# Patient Record
Sex: Female | Born: 1965 | Race: White | Hispanic: No | Marital: Married | Smoking: Never smoker
Health system: Southern US, Community
[De-identification: ages and names within clinical notes are randomized; demographics above are authoritative.]

## PROBLEM LIST (undated history)

## (undated) DIAGNOSIS — Z8669 Personal history of other diseases of the nervous system and sense organs: Secondary | ICD-10-CM

## (undated) DIAGNOSIS — F988 Other specified behavioral and emotional disorders with onset usually occurring in childhood and adolescence: Secondary | ICD-10-CM

## (undated) DIAGNOSIS — Z91419 Personal history of unspecified adult abuse: Secondary | ICD-10-CM

## (undated) DIAGNOSIS — M419 Scoliosis, unspecified: Secondary | ICD-10-CM

## (undated) HISTORY — DX: Other specified behavioral and emotional disorders with onset usually occurring in childhood and adolescence: F98.8

## (undated) HISTORY — DX: Personal history of other diseases of the nervous system and sense organs: Z86.69

## (undated) HISTORY — PX: BREAST BIOPSY: SHX20

## (undated) HISTORY — PX: HYSTEROSCOPY: SHX211

## (undated) HISTORY — DX: Scoliosis, unspecified: M41.9

## (undated) HISTORY — DX: Personal history of unspecified adult abuse: Z91.419

---

## 2002-10-18 ENCOUNTER — Encounter: Admission: RE | Admit: 2002-10-18 | Discharge: 2002-10-18 | Payer: Self-pay | Admitting: Obstetrics and Gynecology

## 2002-10-18 ENCOUNTER — Encounter: Payer: Self-pay | Admitting: Obstetrics and Gynecology

## 2002-10-30 ENCOUNTER — Encounter: Admission: RE | Admit: 2002-10-30 | Discharge: 2002-10-30 | Payer: Self-pay | Admitting: Urology

## 2002-10-30 ENCOUNTER — Encounter: Payer: Self-pay | Admitting: Urology

## 2002-11-30 ENCOUNTER — Encounter: Payer: Self-pay | Admitting: Obstetrics and Gynecology

## 2002-12-03 ENCOUNTER — Inpatient Hospital Stay (HOSPITAL_COMMUNITY): Admission: RE | Admit: 2002-12-03 | Discharge: 2002-12-05 | Payer: Self-pay | Admitting: Obstetrics and Gynecology

## 2002-12-03 ENCOUNTER — Encounter: Payer: Self-pay | Admitting: Obstetrics and Gynecology

## 2002-12-03 ENCOUNTER — Encounter (INDEPENDENT_AMBULATORY_CARE_PROVIDER_SITE_OTHER): Payer: Self-pay | Admitting: Specialist

## 2006-08-19 ENCOUNTER — Encounter: Admission: RE | Admit: 2006-08-19 | Discharge: 2006-08-19 | Payer: Self-pay

## 2006-08-25 ENCOUNTER — Encounter: Admission: RE | Admit: 2006-08-25 | Discharge: 2006-08-25 | Payer: Self-pay

## 2006-09-02 ENCOUNTER — Encounter (INDEPENDENT_AMBULATORY_CARE_PROVIDER_SITE_OTHER): Payer: Self-pay | Admitting: Specialist

## 2006-09-02 ENCOUNTER — Encounter: Admission: RE | Admit: 2006-09-02 | Discharge: 2006-09-02 | Payer: Self-pay

## 2006-09-15 ENCOUNTER — Encounter: Admission: RE | Admit: 2006-09-15 | Discharge: 2006-09-15 | Payer: Self-pay

## 2006-09-21 ENCOUNTER — Ambulatory Visit: Payer: Self-pay | Admitting: Oncology

## 2006-10-14 ENCOUNTER — Encounter: Admission: RE | Admit: 2006-10-14 | Discharge: 2006-10-14 | Payer: Self-pay | Admitting: Surgery

## 2006-10-17 ENCOUNTER — Ambulatory Visit (HOSPITAL_BASED_OUTPATIENT_CLINIC_OR_DEPARTMENT_OTHER): Admission: RE | Admit: 2006-10-17 | Discharge: 2006-10-17 | Payer: Self-pay | Admitting: Surgery

## 2006-10-17 ENCOUNTER — Encounter: Admission: RE | Admit: 2006-10-17 | Discharge: 2006-10-17 | Payer: Self-pay

## 2006-10-17 ENCOUNTER — Encounter (INDEPENDENT_AMBULATORY_CARE_PROVIDER_SITE_OTHER): Payer: Self-pay | Admitting: Specialist

## 2006-11-14 ENCOUNTER — Ambulatory Visit: Payer: Self-pay | Admitting: Oncology

## 2008-04-10 ENCOUNTER — Encounter: Admission: RE | Admit: 2008-04-10 | Discharge: 2008-04-10 | Payer: Self-pay

## 2008-04-18 ENCOUNTER — Encounter: Admission: RE | Admit: 2008-04-18 | Discharge: 2008-04-18 | Payer: Self-pay

## 2008-04-19 ENCOUNTER — Encounter: Admission: RE | Admit: 2008-04-19 | Discharge: 2008-04-19 | Payer: Self-pay

## 2008-04-19 ENCOUNTER — Encounter (INDEPENDENT_AMBULATORY_CARE_PROVIDER_SITE_OTHER): Payer: Self-pay | Admitting: Diagnostic Radiology

## 2008-05-05 IMAGING — CR DG CHEST 2V
2 series · 2 of 2 positions shown · non-contrast
Comparison: [HOSPITAL] chest x-ray report, 11/30/02.

CLINICAL DATA: Preop respiratory exam for left breast surgery.  Atypical ductal hyperplasia.
CHEST - TWO VIEWS:

[w chest pa]
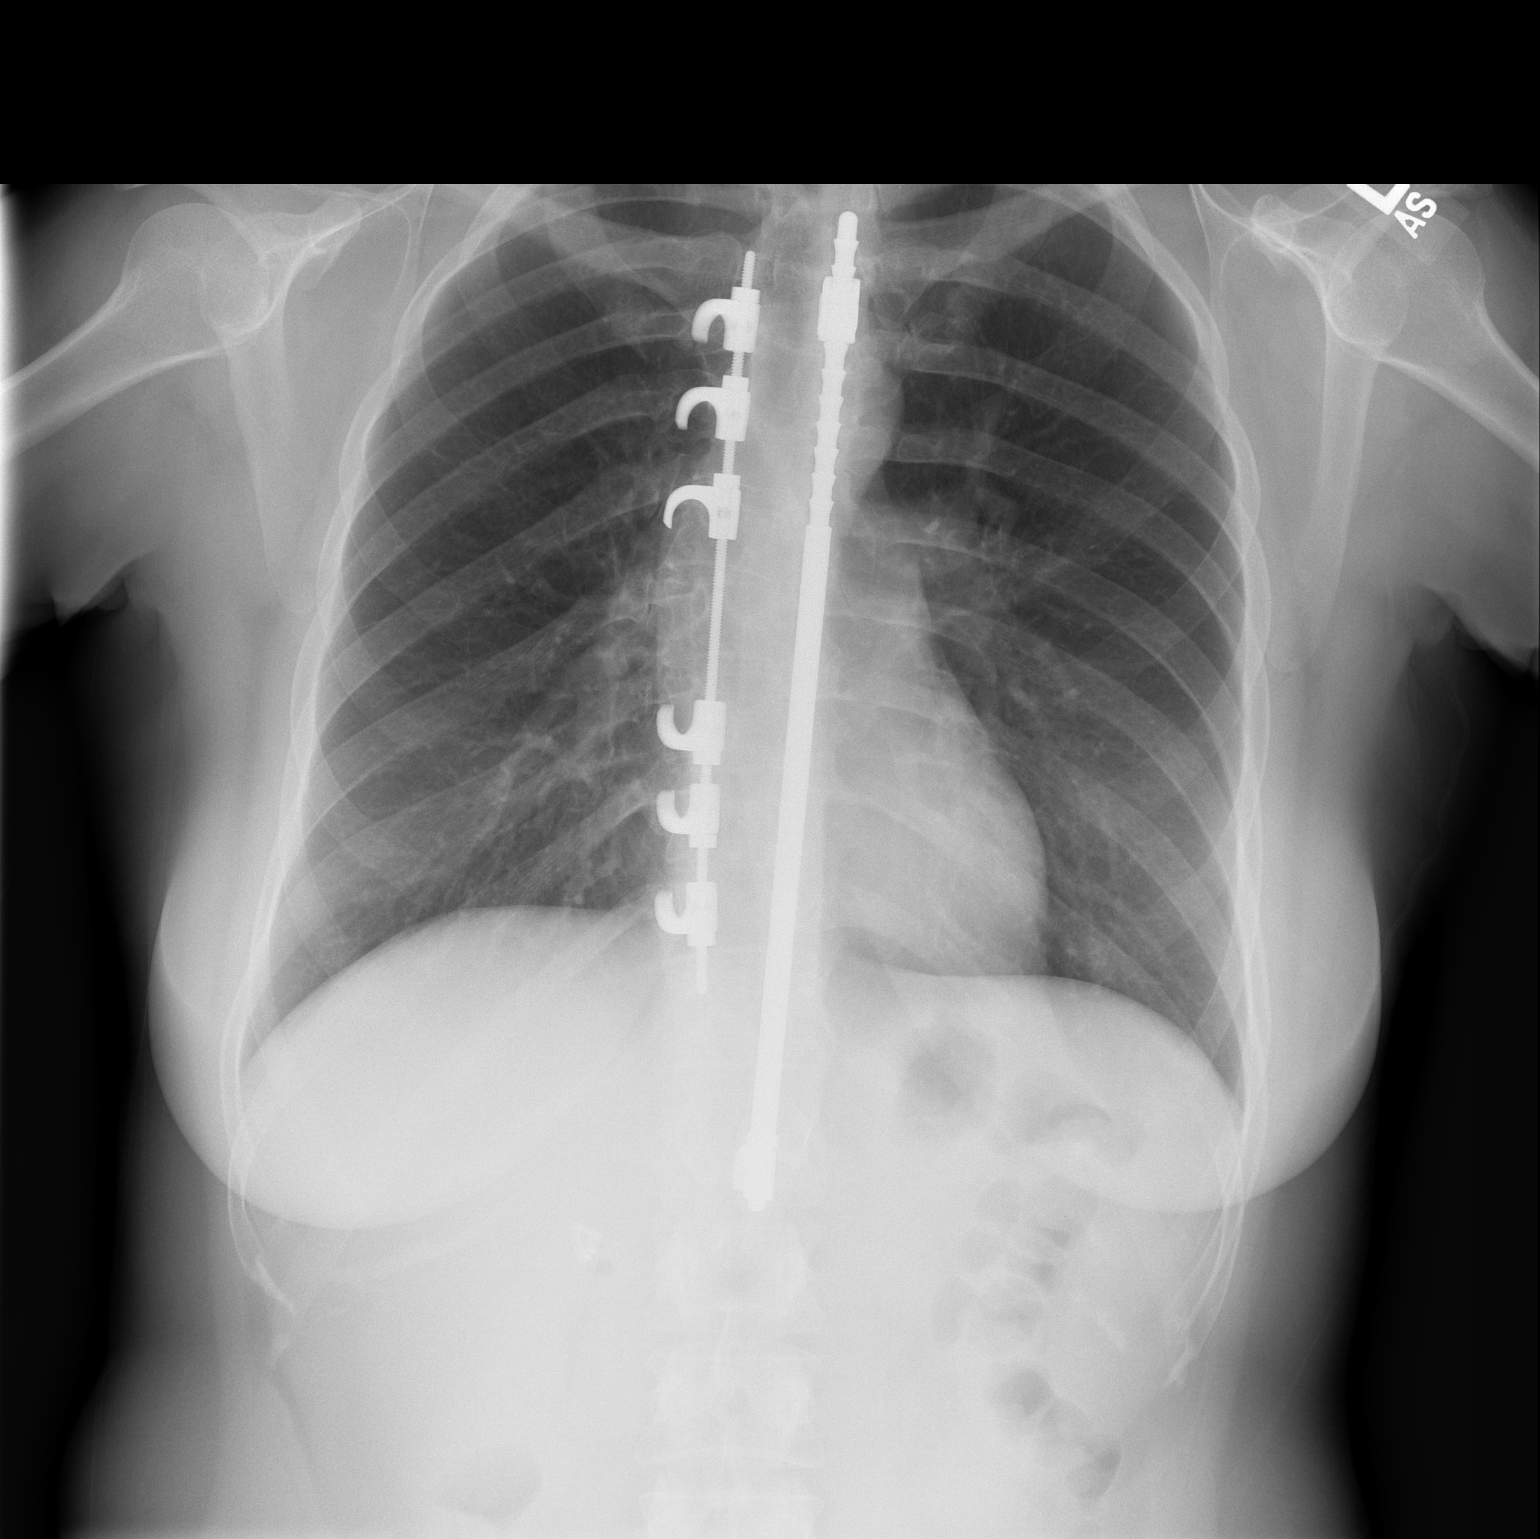

[w chest lat]
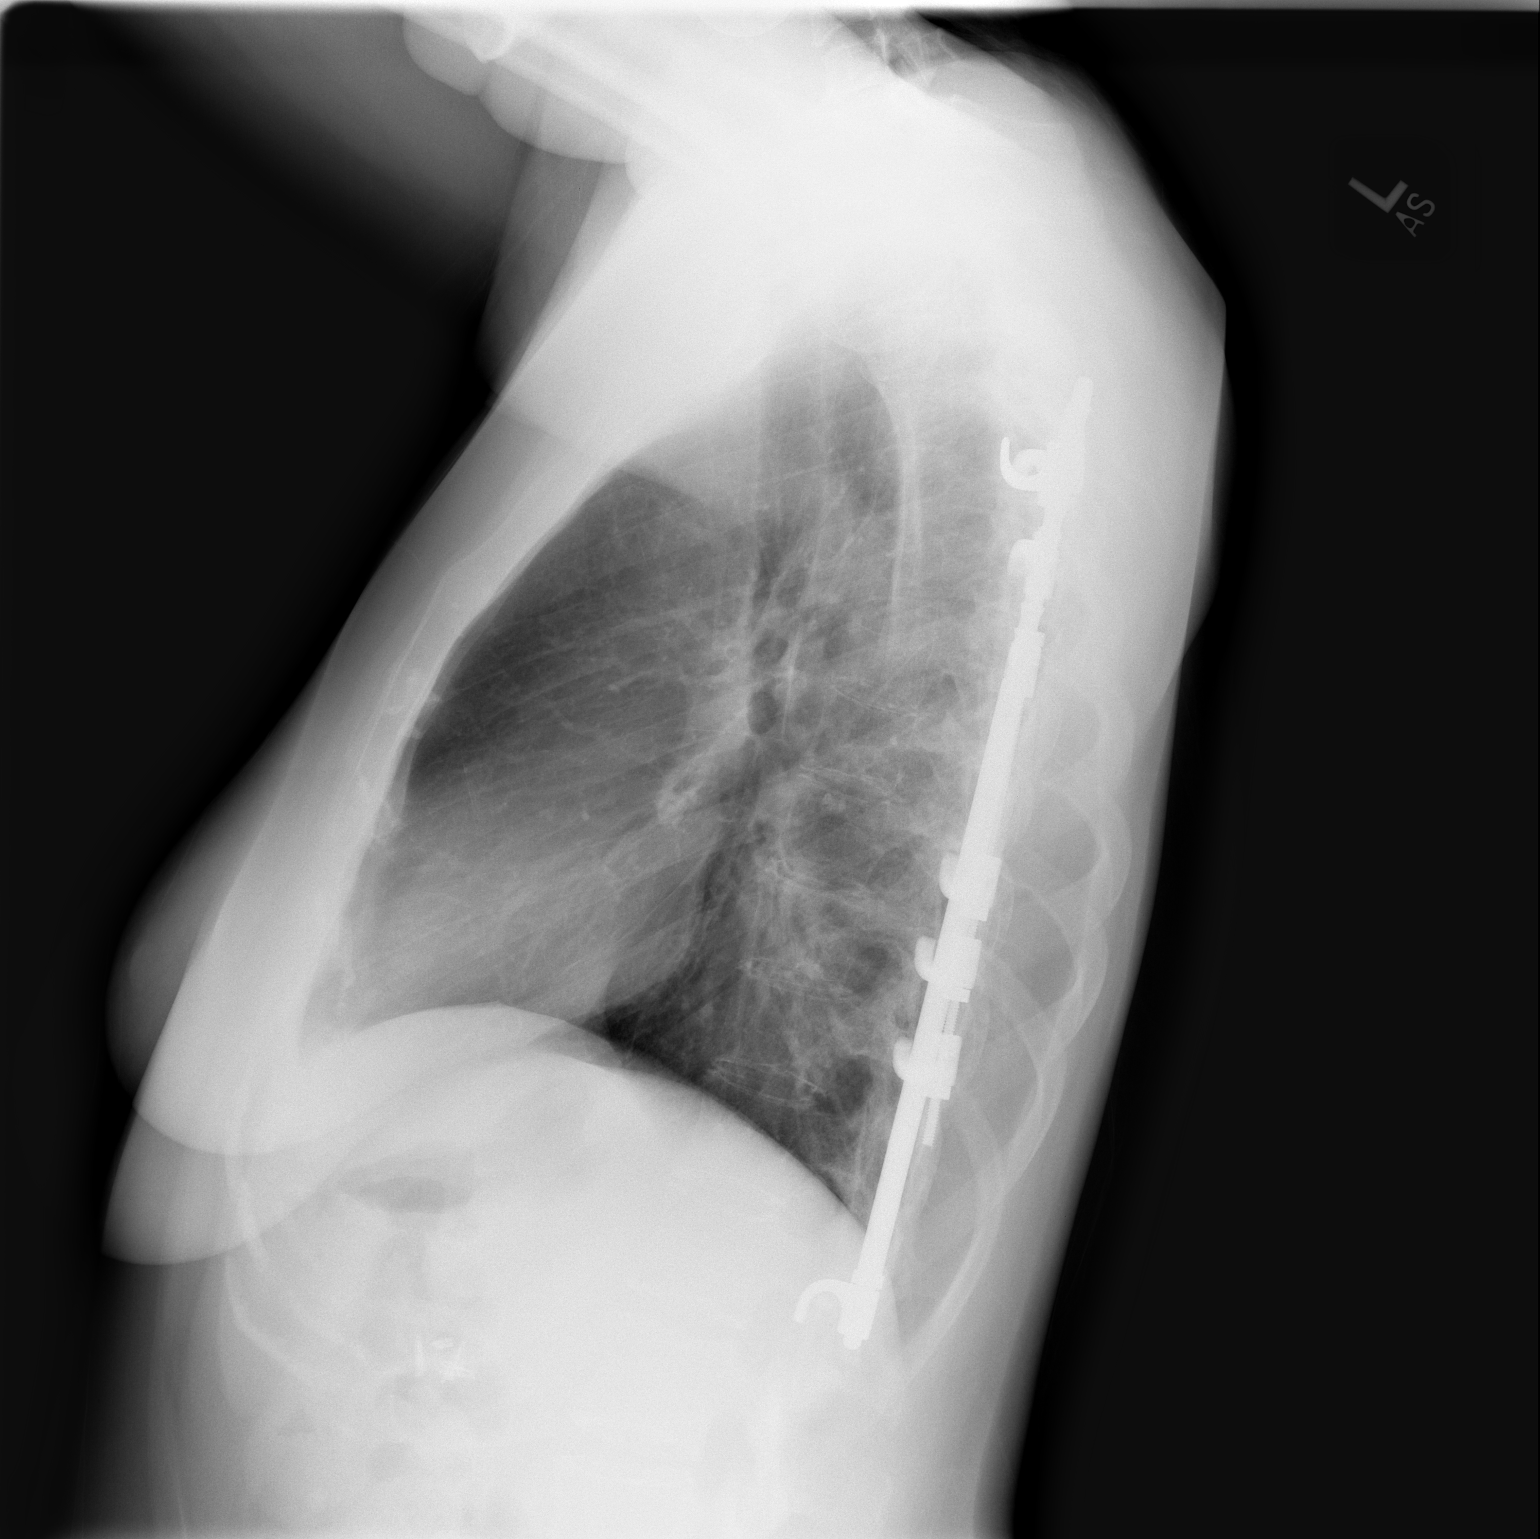

[2 of 2 positions shown; findings below may reference images not displayed]

The heart size and mediastinal contours are within normal limits.  Both lungs are clear.  The visualized skeletal structures are unremarkable with slight dextroscoliosis of thoracic spine with Pasupulati distraction rods noted.
IMPRESSION: No active cardiopulmonary disease.

## 2009-08-27 ENCOUNTER — Encounter: Admission: RE | Admit: 2009-08-27 | Discharge: 2009-08-27 | Payer: Self-pay

## 2010-10-18 ENCOUNTER — Encounter: Payer: Self-pay | Admitting: Orthopedic Surgery

## 2011-02-12 NOTE — Op Note (Signed)
[name], [name]                           ACCOUNT NO.:  [id number]   MEDICAL RECORD NO.:  [id number]                   PATIENT TYPE:  INP   LOCATION:  9118                                 FACILITY:  [facility]   PHYSICIAN:  Lenoard Aden, M.D.             DATE OF BIRTH:  11/17/1965   DATE OF PROCEDURE:  12/03/2002  DATE OF DISCHARGE:                                 OPERATIVE REPORT   PREOPERATIVE DIAGNOSIS:  Symptomatic uterine fibroids.   POSTOPERATIVE DIAGNOSES:  1. Symptomatic uterine fibroids.  2. Enterocele.   PROCEDURES:  1. Total abdominal hysterectomy.  2. Rogelio Seen culdoplasty.   SURGEON:  Lenoard Aden, M.D.   ASSISTANT:  Pershing Cox, M.D.   ANESTHESIA:  General.   ESTIMATED BLOOD LOSS:  500 mL.   COMPLICATIONS:  None.   DRAINS:  Foley.   COUNTS:  Correct.   DISPOSITION:  Patient to recovery in good condition.   DESCRIPTION OF PROCEDURE:  After completion of her laparoscopic  cholecystectomy, dilute Marcaine is placed in the area of a previous  Pfannenstiel skin incision, which is opened using a scalpel, carried down to  the fascia using electrocautery.  The fascia opened transversely, peritoneum  entered sharply, uterus exteriorized.  A bulky uterus, normal ovaries.  Round ligaments are bilaterally grasped and ligated using a LigaSure and  also suture ligated and held.  Retroperitoneal space entered.  Tubo-ovarian  ligament is bilaterally grasped and ligated using a LigaSure.  The uterine  vessels are skeletonized bilaterally and divided using the LigaSure.  The  bladder flap is dissected sharply.  There is evidence of sharp bladder  adhesions from previous C-section noted.  These are dissected atraumatically  off the lower uterine segment.  Uterine vessels having been skeletonized,  clamped, and divided bilaterally, progressive clamping down the broad and  cardinal ligament complexes are achieved using the LigaSure.  Uterosacral  ligaments  are identified, grasped, and suture ligated using Heaney clamps  and a 0 Vicryl suture.  The vagina is entered sharply.  The specimen is  removed.  A 700 g uterus, which is weighed in the room.  At this time  enterocele is identified and the angle sutures are placed bilaterally in a  standard fashion.  The vagina closed side-to-side.  McCall culdoplasty  suture using a 0 Vicryl suture placed.  Irrigation accomplished.  Good  hemostasis noted.  Ureters identified bilaterally and found to be of normal  size and caliber and peristalsing normally.  Normal appendix  visualized.  Vaginal cuff hemostatic.  Uterosacral plication of the  ligaments in the midline, which are tied together.  At this time fascia is  closed using #1 Monocryl in a continuous running fashion.  Irrigation is  accomplished.  Skin is closed using staples.  The patient is awakened and  transferred to recovery in good condition.  Lenoard Aden, M.D.    RJT/MEDQ  D:  12/03/2002  T:  12/03/2002  Job:  [id number]

## 2011-02-12 NOTE — Op Note (Signed)
NAMESHIRRELL, [name]                 ACCOUNT NO.:  [id number]   MEDICAL RECORD NO.:  [id number]          PATIENT TYPE:  AMB   LOCATION:  DSC                          FACILITY:  [facility]   PHYSICIAN:  Thomas A. Cornett, M.D.DATE OF BIRTH:  [birth year]   DATE OF PROCEDURE:  DATE OF DISCHARGE:                               OPERATIVE REPORT   PREOPERATIVE DIAGNOSIS:  Left breast mass showing atypical ductal  hyperplasia on core biopsy.   POSTOPERATIVE DIAGNOSIS:  Left breast mass showing atypical ductal  hyperplasia on core biopsy.   PROCEDURE:  Left breast needle localized excisional biopsy.   SURGEON:  Harriette Bouillon, M.D.   ANESTHESIA:  MAC with approximately 30 cc of 0.25% Sensorcaine.   SPECIMEN:  Left breast tissue with two localizing wires, one anterior  one posterior.  Additional left breast tissue taken from the anterior  margin.   DRAINS:  None.   ESTIMATED BLOOD LOSS:  20 cc.   INDICATIONS FOR PROCEDURE:  The patient is a 45 year old female found to  have a cluster of left breast microcalcifications on mammogram.  Biopsy  showed atypical ductal hyperplasia.  This was an area so a bracketed  left breast needle-localized excisional biopsy was recommended for  further diagnosis.  I discussed this with the patient.  I explained the  procedure to her as well as complications.  She understood and agreed to  proceed.   DESCRIPTION OF PROCEDURE:  The patient was brought to the operating room  after undergoing left breast needle localization by the radiologist.  Two wires were used to localize the area.  After this was done, the  patient was taken back to the operating room where her left breast was  prepped and draped in sterile fashion.  After MAC anesthesia, local  anesthesia was infiltrated in the left upper outer quadrant between the  two wires.  Incision was made between both wires.  Dissection was  carried down to encompass both areas of the wire.  We were so close to  the anterior margin.  Both wire tissues were sent and the anterior  margin was felt to be close and there was some concern that some of the  calcifications may have been left out.  I was able to identify this area  anteriorly and excised it using a scalpel and sent it to pathology.  The  stitch marked the new surgical margin.  The cavity was inspected and  found it be hemostatic.  This tissue was found to be adequate from the  standpoint of both wires being in it and again the anterior margins were  re-excised for further evaluation.  Hemostasis was achieved.  The wound  was closed in layers using a 3-0 Vicryl deep stitch and a 4-0 Monocryl  in a subcuticular fashion.  All sponge, needle and  instruments were found to be correct this portion of the case.  Dermabond was used as a dressing over the incision.  All final counts  were found to be correct a second time.  The patient was awoke and taken  to the recovery in  satisfactory condition.      Thomas A. Cornett, M.D.  Electronically Signed     TAC/MEDQ  D:  10/17/2006  T:  10/17/2006  Job:  [id number]   cc:   Konrad Felix

## 2011-02-12 NOTE — Discharge Summary (Signed)
   [name], [name]                           ACCOUNT NO.:  [id number]   MEDICAL RECORD NO.:  [id number]                   PATIENT TYPE:  INP   LOCATION:  9118                                 FACILITY:  [facility]   PHYSICIAN:  Lenoard Aden, M.D.             DATE OF BIRTH:  [birth year]   DATE OF ADMISSION:  12/03/2002  DATE OF DISCHARGE:  12/05/2002                                 DISCHARGE SUMMARY   HISTORY OF PRESENT ILLNESS:  The patient underwent uncomplicated  laparoscopic cholecystectomy followed by total abdominal hysterectomy and  [name] on December 03, 2002. Postoperative course uncomplicated.  Hemoglobin stable.   DISPOSITION:  Discharged to home on day two. Darvocet and Motrin given for  pain.   FOLLOW UP:  Follow-up in the office in 4-6 weeks. Discharge teaching done.  Iron therapy previously recommended and discussed.                                               Lenoard Aden, M.D.    RJT/MEDQ  D:  01/12/2003  T:  01/12/2003  Job:  [id number]

## 2011-02-12 NOTE — H&P (Signed)
[name], [name]                           ACCOUNT NO.:  [id number]   MEDICAL RECORD NO.:  [id number]                   PATIENT TYPE:  INP   LOCATION:  NA                                   [facility]ACILITY[facility]   PHYSICIAN:  [name], M.D.             DATE OF BIRTH:  May 24, 1966   DATE OF ADMISSION:  12/03/2002  DATE OF DISCHARGE:                                HISTORY & PHYSICAL   CHIEF COMPLAINT:  Bleeding and secondary anemia.   HISTORY OF PRESENT ILLNESS:  The patient is a 45 year old white female G4,  P3, with a history of irregular bleeding and secondary anemia down to 7.7.  She has had known history of uterine fibroids which have been followed  previously.  She had a large lesion which was documented by CT scan noted to  a uterine fibroid.  She was placed on iron therapy and has moved her  hemoglobin to 11.6.  She had a CAT scan in which she had a questionable  renal cortical cyst which was addressed by Dr. Marcelyn Warren and felt to  benign, in addition the suggestion of gallbladder disease for which she has  seen Dr. Jerelene Warren, and although the gallbladder removal is not vital  the patient has elected to proceed with removal at this time.   PAST MEDICAL HISTORY:  Tubal ligation and a previous C-section with that  after which time she had a paratubal salpingectomy in addition to removal of  pieces of both of her tubes for tubal ligation.  She otherwise has a history  of two vaginal deliveries.  She has no other medical or surgical  hospitalizations as noted.  She has had one uncomplicated miscarriage and  three total deliveries - two vaginal, one otherwise.  She has had a blood  transfusion in 1981 and has a remote history of asthma.   FAMILY HISTORY:  Grandfather with diabetes; grandparents with heart disease  and hypotension.   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   PHYSICAL EXAMINATION:  GENERAL:  She is a  well-developed, well-nourished  white female in no apparent distress.  HEENT:  Normal.  LUNGS:  Clear.  HEART:  Regular rhythm.  ABDOMEN:  Soft, nontender; uterus to 18 to 20 weeks' size; no adnexal masses  are appreciated.  EXTREMITIES:  No cords.  NEUROLOGICAL:  Nonfocal.   IMPRESSION:  1. Symptomatic uterine fibroids with secondary anemia.  2. Cholelithiasis.  3. Benign renal cysts.  4. History of previous cesarean section with tubal ligation.    PLAN:  Proceed with laparoscopic cholecystectomy.  Surgical consent to be  done by Dr. Maryagnes Warren followed by a TAH, possible BSO.  Risks of anesthesia,  infection, bleeding, injury to abdominal organs and need for repairs  discussed, delayed versus immediate complications to include bowel and  bladder injury noted.  The patient acknowledges  and wishes to proceed.                                               [name], M.D.    RJT/MEDQ  D:  12/02/2002  T:  12/02/2002  Job:  [id number]

## 2011-02-12 NOTE — Op Note (Signed)
[name], [name]                           ACCOUNT NO.:  [id number]   MEDICAL RECORD NO.:  [id number]                   PATIENT TYPE:  INP   LOCATION:  9399                                 FACILITY:  [facility]   PHYSICIAN:  Gita Kudo, M.D.              DATE OF BIRTH:  10-02-1965   DATE OF PROCEDURE:  12/03/2002  DATE OF DISCHARGE:                                 OPERATIVE REPORT   PREOPERATIVE DIAGNOSIS:  Gallstones.   POSTOPERATIVE DIAGNOSIS:  Gallstones, normal-appearing intraoperative  cholangiogram.   PROCEDURE:  Laparoscopic cholecystectomy with intraoperative cholangiogram.   SURGEON:  Gita Kudo, M.D.   ASSISTANT:  Donnie Coffin. Samuella Cota, M.D.   ANESTHESIA:  General endotracheal.   CLINICAL SUMMARY:  A 45 year old female with bouts of abdominal pain but  nonspecific for acute biliary disease.  A gallbladder ultrasound and CT scan  had been obtained, and they show gallstones.  No acute inflammation.  Her  liver function studies are normal.   OPERATIVE FINDINGS:  The patient's gallstones was thin-walled and appeared  to have several stones in it.  There was a small stone that was impacted in  the cystic duct that was removed before doing the cholangiogram, which  appeared normal.   DESCRIPTION OF PROCEDURE:  The patient was given intravenous Cipro, prepped  and draped in a standard fashion for both laparoscopic cholecystectomy and  abdominal hysterectomy by Dr. Billy Coast.  A small midline incision was made at  the umbilicus and carried down into the peritoneum.  The fascia was  controlled with a figure-of-eight 0 Vicryl suture and operating Hasson port  inserted and good CO2 pneumoperitoneum established.  Then through Marcaine-  infiltrated skin incisions, two #5 ports placed laterally and a second #10  medially.  With the lateral port graspers giving excellent exposure, we  operated through the medial port and carefully dissected the gallbladder-  cystic duct  junction.  A clip was placed on the gallbladder at the junction  and then an incision made in the cystic duct.  The stone was manipulated out  of the opening and then through a second puncture wound, a cholangiogram  catheter placed, secured, and good films taken.  The proximal system all  looked fine, and likewise the distal system had no obstruction with good  flow into the common duct.  Then this catheter was withdrawn and the cystic  duct controlled with multiple clips and divided.  The cystic artery was  dissected and when we were certain of its anatomy with circumferential  visualization, it likewise was controlled with clips and divided.  The  gallbladder was then removed from below upward using the coagulating spatula  for hemostasis and dissection.  The operative site was lavaged with saline  and noted to be dry.  Then cameras and ports and CO2 were released.  The  midline  closed with the previous figure-of-eight and a  second interrupted 0 Vicryl.  Subcu approximated with 4-0 Vicryl and then staples applied to the skin.  Sterile dressings will be placed latera after Dr. Billy Coast completes the  hysterectomy, and he came in to perform that portion.                                               Gita Kudo, M.D.    MRL/MEDQ  D:  12/03/2002  T:  12/03/2002  Job:  [id number]   cc:   Lenoard Aden, M.D.  [address], [address]  [place]  [place] [address]  Fax: [phone]

## 2021-08-25 NOTE — Progress Notes (Deleted)
NEUROLOGY CONSULTATION NOTE  [name] MRN: [id number] DOB: 01/29/1966  Referring provider: Graylon Gunning, PA-C Primary care provider: Graylon Gunning, PA-C  Reason for consult:  headache  Assessment/Plan:   ***   Subjective:  [name] is a 55 year old female who presents for headaches.  History supplemented by referring provider's note.  Onset:  March 2021 following COVID infection Location:  band-like distribution Quality:  *** Intensity:  ***.  *** denies new headache, thunderclap headache or severe headache that wakes *** from sleep. Aura:  *** Prodrome:  *** Postdrome:  *** Associated symptoms:  ***dizziness.  Sometimes associated with speech disturbance (slurred).  *** denies associated unilateral numbness or weakness. Duration:  *** Frequency:  *** Frequency of abortive medication: *** Triggers:  *** Relieving factors:  *** Activity:  ***  Current NSAIDS/analgesics:  Tylenol, Advil, Toradol Current triptans:  *** Current ergotamine:  *** Current anti-emetic:  *** Current muscle relaxants:  *** Current Antihypertensive medications:  *** Current Antidepressant medications:  *** Current Anticonvulsant medications:  *** Current anti-CGRP:  Emgality Current Vitamins/Herbal/Supplements:  Magnesium oxide 400mg , Zinc, C Current Antihistamines/Decongestants:  Allegra Other therapy:  *** Hormone/birth control:  *** Other medications:  Adderal  Past NSAIDS/analgesics:  Percocet/opioids (uticaria) Past abortive triptans:  *** Past abortive ergotamine:  *** Past muscle relaxants:  *** Past anti-emetic:  *** Past antihypertensive medications:  *** Past antidepressant medications:  *** Past anticonvulsant medications:  *** Past anti-CGRP:  Nurtec Past vitamins/Herbal/Supplements:  *** Past antihistamines/decongestants:  *** Other past therapies:  ***  Caffeine:  *** Alcohol:  *** Smoker:  *** Diet:  *** Exercise:  *** Depression:  ***; Anxiety:   *** Other pain:  *** Sleep hygiene:  *** Family history of headache:  ***      PAST MEDICAL HISTORY: No past medical history on file.  PAST SURGICAL HISTORY: *** The histories are not reviewed yet. Please review them in the "History" navigator section and refresh this SmartLink.  MEDICATIONS: No current outpatient medications on file prior to visit.   No current facility-administered medications on file prior to visit.    ALLERGIES: Not on File  FAMILY HISTORY: No family history on file.  Objective:  *** General: No acute distress.  Patient appears well-groomed.   Head:  Normocephalic/atraumatic Eyes:  fundi examined but not visualized Neck: supple, no paraspinal tenderness, full range of motion Back: No paraspinal tenderness Heart: regular rate and rhythm Lungs: Clear to auscultation bilaterally. Vascular: No carotid bruits. Neurological Exam: Mental status: alert and oriented to person, place, and time, recent and remote memory intact, fund of knowledge intact, attention and concentration intact, speech fluent and not dysarthric, language intact. Cranial nerves: CN I: not tested CN II: pupils equal, round and reactive to light, visual fields intact CN III, IV, VI:  full range of motion, no nystagmus, no ptosis CN V: facial sensation intact. CN VII: upper and lower face symmetric CN VIII: hearing intact CN IX, X: gag intact, uvula midline CN XI: sternocleidomastoid and trapezius muscles intact CN XII: tongue midline Bulk & Tone: normal, no fasciculations. Motor:  muscle strength 5/5 throughout Sensation:  Pinprick, temperature and vibratory sensation intact. Deep Tendon Reflexes:  2+ throughout,  toes downgoing.   Finger to nose testing:  Without dysmetria.   Heel to shin:  Without dysmetria.   Gait:  Normal station and stride.  Romberg negative.    Thank you for allowing me to take part in the care of this patient.   Everlena Cooper, DO  CC: Graylon Gunning,  PA-C

## 2021-08-26 ENCOUNTER — Ambulatory Visit: Payer: BC Managed Care – PPO | Admitting: Neurology
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
## 2021-10-13 ENCOUNTER — Ambulatory Visit: Payer: BC Managed Care – PPO | Admitting: Neurology

## 2021-10-13 ENCOUNTER — Encounter: Payer: Self-pay | Admitting: Neurology

## 2021-10-13 ENCOUNTER — Other Ambulatory Visit: Payer: Self-pay

## 2021-10-13 VITALS — BP 111/68 | HR 93 | Ht 64.0 in | Wt 149.0 lb

## 2021-10-13 DIAGNOSIS — M542 Cervicalgia: Secondary | ICD-10-CM

## 2021-10-13 DIAGNOSIS — G43009 Migraine without aura, not intractable, without status migrainosus: Secondary | ICD-10-CM | POA: Diagnosis not present

## 2021-10-13 MED ORDER — ONDANSETRON 4 MG PO TBDP: 4.0000 mg | ORAL_TABLET | Freq: Three times a day (TID) | ORAL | 5 refills | Status: AC | PRN

## 2021-10-13 MED ORDER — SUMATRIPTAN SUCCINATE 100 MG PO TABS: 100.0000 mg | ORAL_TABLET | Freq: Once | ORAL | 5 refills | Status: AC | PRN

## 2021-10-13 MED ORDER — AIMOVIG 140 MG/ML ~~LOC~~ SOAJ: 140.0000 mg | SUBCUTANEOUS | 5 refills | Status: AC

## 2021-10-13 NOTE — Patient Instructions (Signed)
°  Physical therapy for neck pain Start Aimovig 140mg  every 28 days.   Take sumatriptan 100mg  at earliest onset of headache.  May repeat dose once in 2 hours if needed.  Maximum 2 tablets in 24 hours.  May still take Nurtec if needed Ondansetron for nausea as needed Limit use of pain relievers to no more than 2 days out of the week.  These medications include acetaminophen, NSAIDs (ibuprofen/Advil/Motrin, naproxen/Aleve, triptans (Imitrex/sumatriptan), Excedrin, and narcotics.  This will help reduce risk of rebound headaches. Be aware of common food triggers:  - Caffeine:  coffee, black tea, cola, Mt. Dew  - Chocolate  - Dairy:  aged cheeses (brie, blue, cheddar, gouda, [place], provolone, [place], Swiss, etc), chocolate milk, buttermilk, sour cream, limit eggs and yogurt  - Nuts, peanut butter  - Alcohol  - Cereals/grains:  FRESH breads (fresh bagels, sourdough, doughnuts), yeast productions  - Processed/canned/aged/cured meats (pre-packaged deli meats, hotdogs)  - MSG/glutamate:  soy sauce, flavor enhancer, pickled/preserved/marinated foods  - Sweeteners:  aspartame (Equal, Nutrasweet).  Sugar and Splenda are okay  - Vegetables:  legumes (lima beans, lentils, snow peas, fava beans, pinto peans, peas, garbanzo beans), sauerkraut, onions, olives, pickles  - Fruit:  avocados, bananas, citrus fruit (orange, lemon, grapefruit), mango  - Other:  Frozen meals, macaroni and cheese Routine exercise Stay adequately hydrated (aim for 64 oz water daily) Keep headache diary Maintain proper stress management Maintain proper sleep hygiene Do not skip meals Consider supplements:  magnesium citrate 400mg  daily, riboflavin 400mg  daily, coenzyme Q10 100mg  three times daily.

## 2021-11-18 ENCOUNTER — Ambulatory Visit: Payer: BC Managed Care – PPO | Admitting: Neurology
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
## 2022-04-20 ENCOUNTER — Encounter: Payer: Self-pay | Admitting: Neurology

## 2022-04-20 ENCOUNTER — Ambulatory Visit: Payer: BC Managed Care – PPO | Admitting: Neurology

## 2022-04-20 DIAGNOSIS — Z029 Encounter for administrative examinations, unspecified: Secondary | ICD-10-CM
